# Patient Record
Sex: Female | Born: 1965 | Race: White | Hispanic: Yes | Smoking: Never smoker
Health system: Southern US, Community
[De-identification: ages and names within clinical notes are randomized; demographics above are authoritative.]

## PROBLEM LIST (undated history)

## (undated) DIAGNOSIS — N200 Calculus of kidney: Secondary | ICD-10-CM

## (undated) DIAGNOSIS — I10 Essential (primary) hypertension: Secondary | ICD-10-CM

---

## 2008-09-09 ENCOUNTER — Emergency Department (HOSPITAL_COMMUNITY): Admission: EM | Admit: 2008-09-09 | Discharge: 2008-09-09 | Payer: Self-pay | Admitting: Emergency Medicine

## 2010-05-19 LAB — URINE CULTURE
Colony Count: NO GROWTH
Culture: NO GROWTH

## 2010-05-19 LAB — URINE MICROSCOPIC-ADD ON

## 2010-05-19 LAB — URINALYSIS, ROUTINE W REFLEX MICROSCOPIC
Glucose, UA: NEGATIVE mg/dL
Ketones, ur: NEGATIVE mg/dL
Nitrite: NEGATIVE
Protein, ur: NEGATIVE mg/dL

## 2010-07-28 IMAGING — CR DG LUMBAR SPINE COMPLETE 4+V
5 series · 5 of 5 positions shown · non-contrast
Comparison: None

CLINICAL DATA: Back pain.  No injury.

LUMBAR SPINE - COMPLETE 4+ VIEW

[t l-spine a.p.]
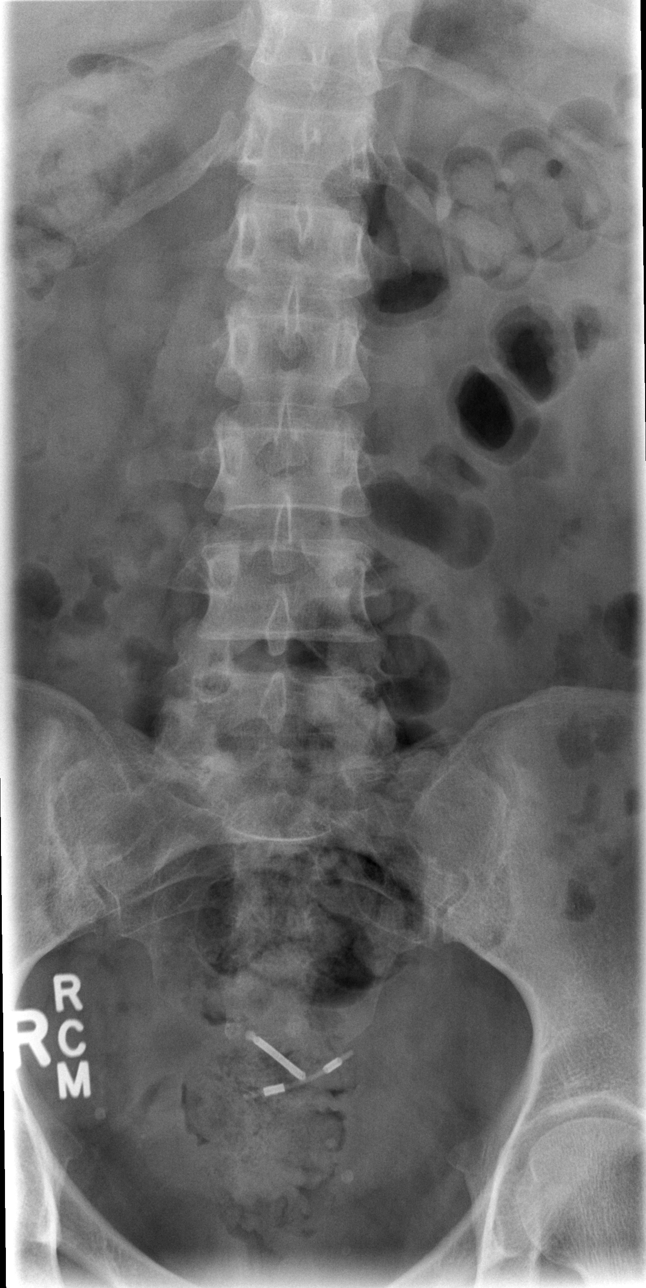

[t l-spine oblique exposure (1 of 2)]
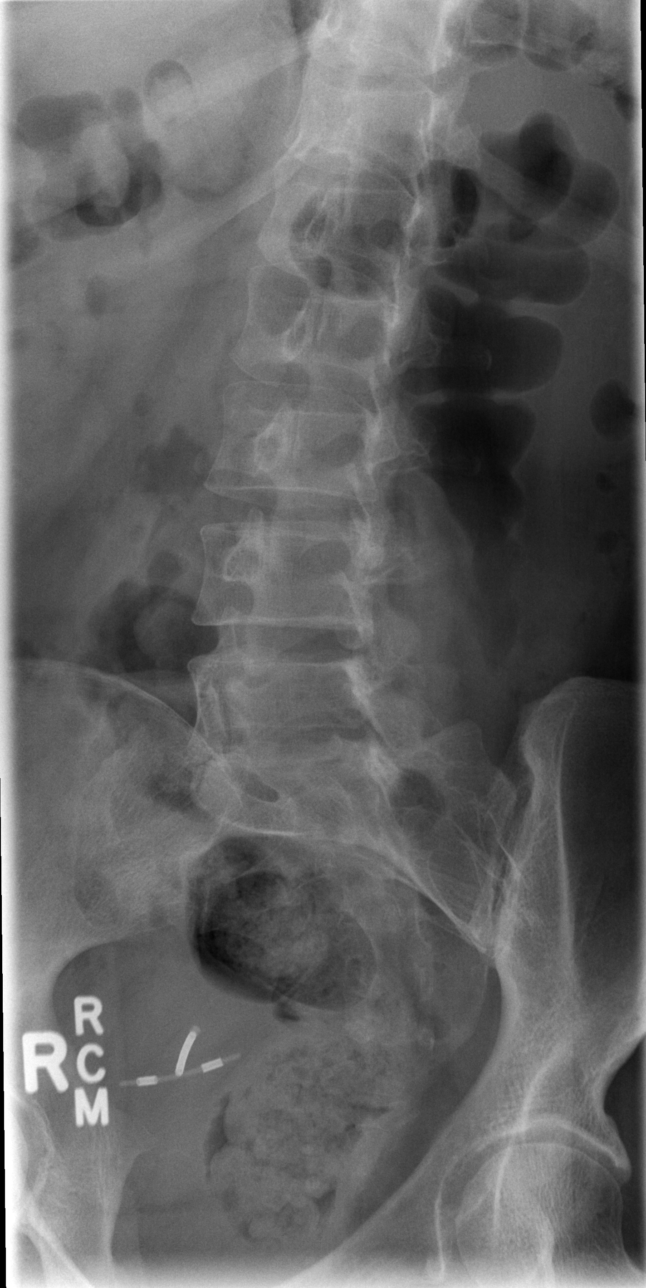

[t l-spine oblique exposure (2 of 2)]
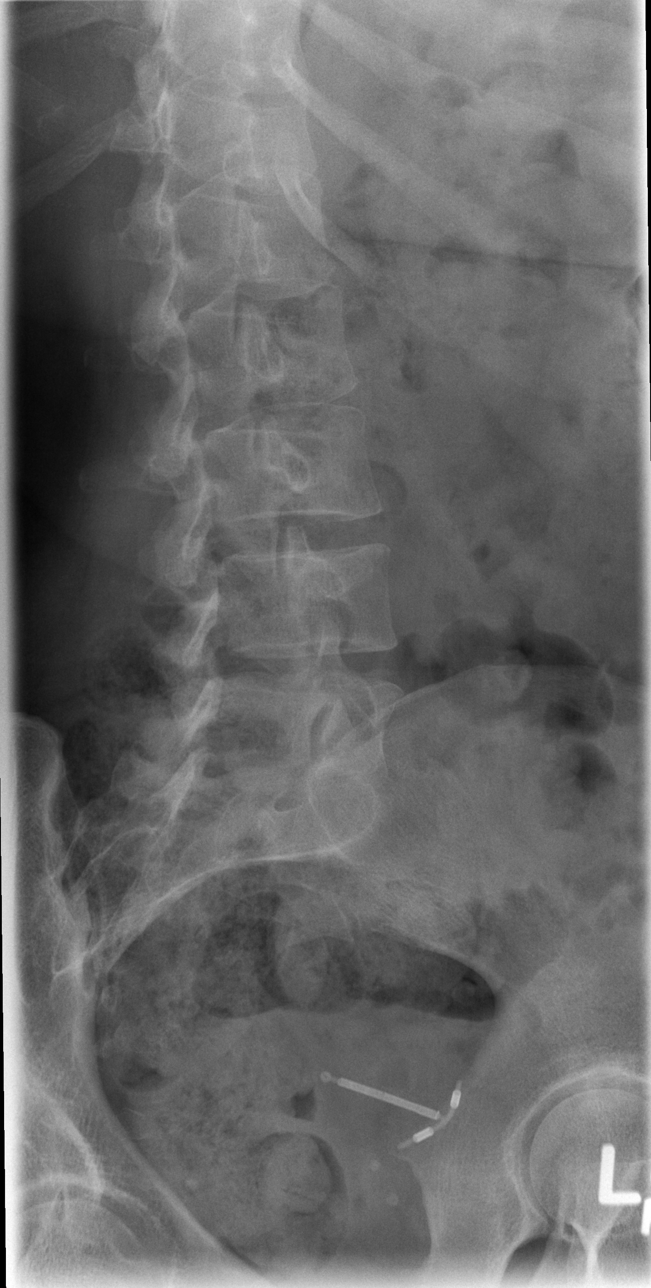

[t l-spine lat]
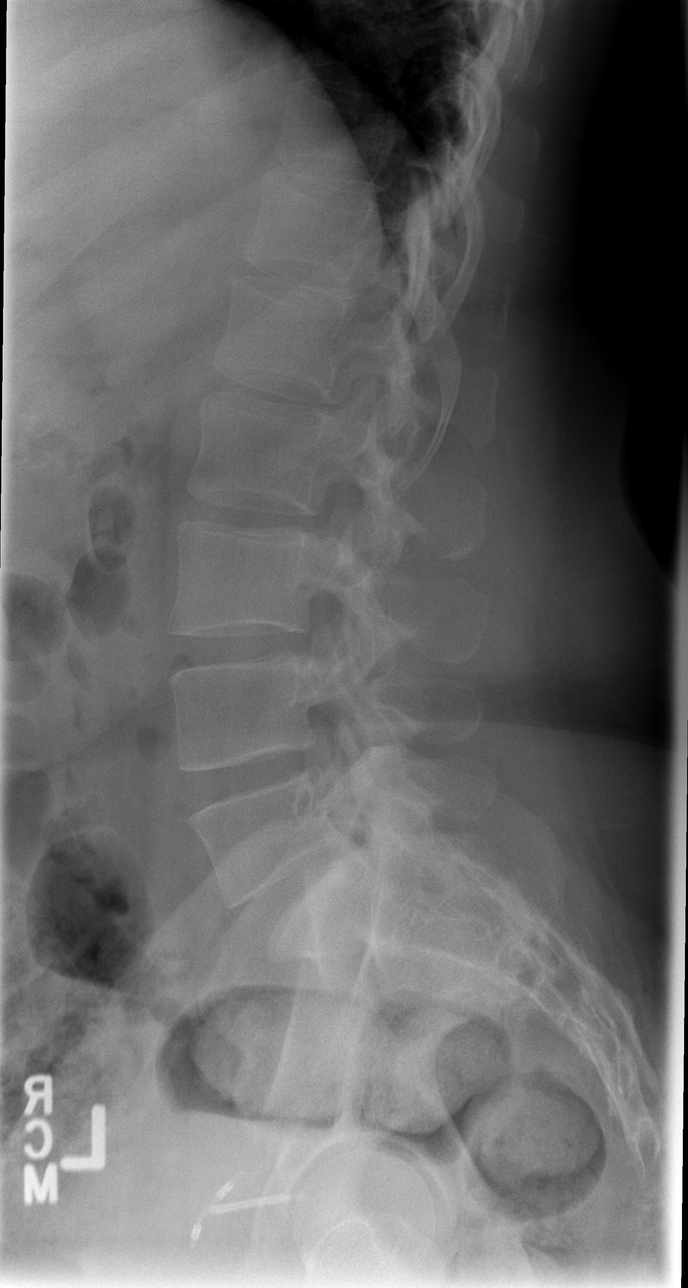

[t l-spine l5-s1 spot]
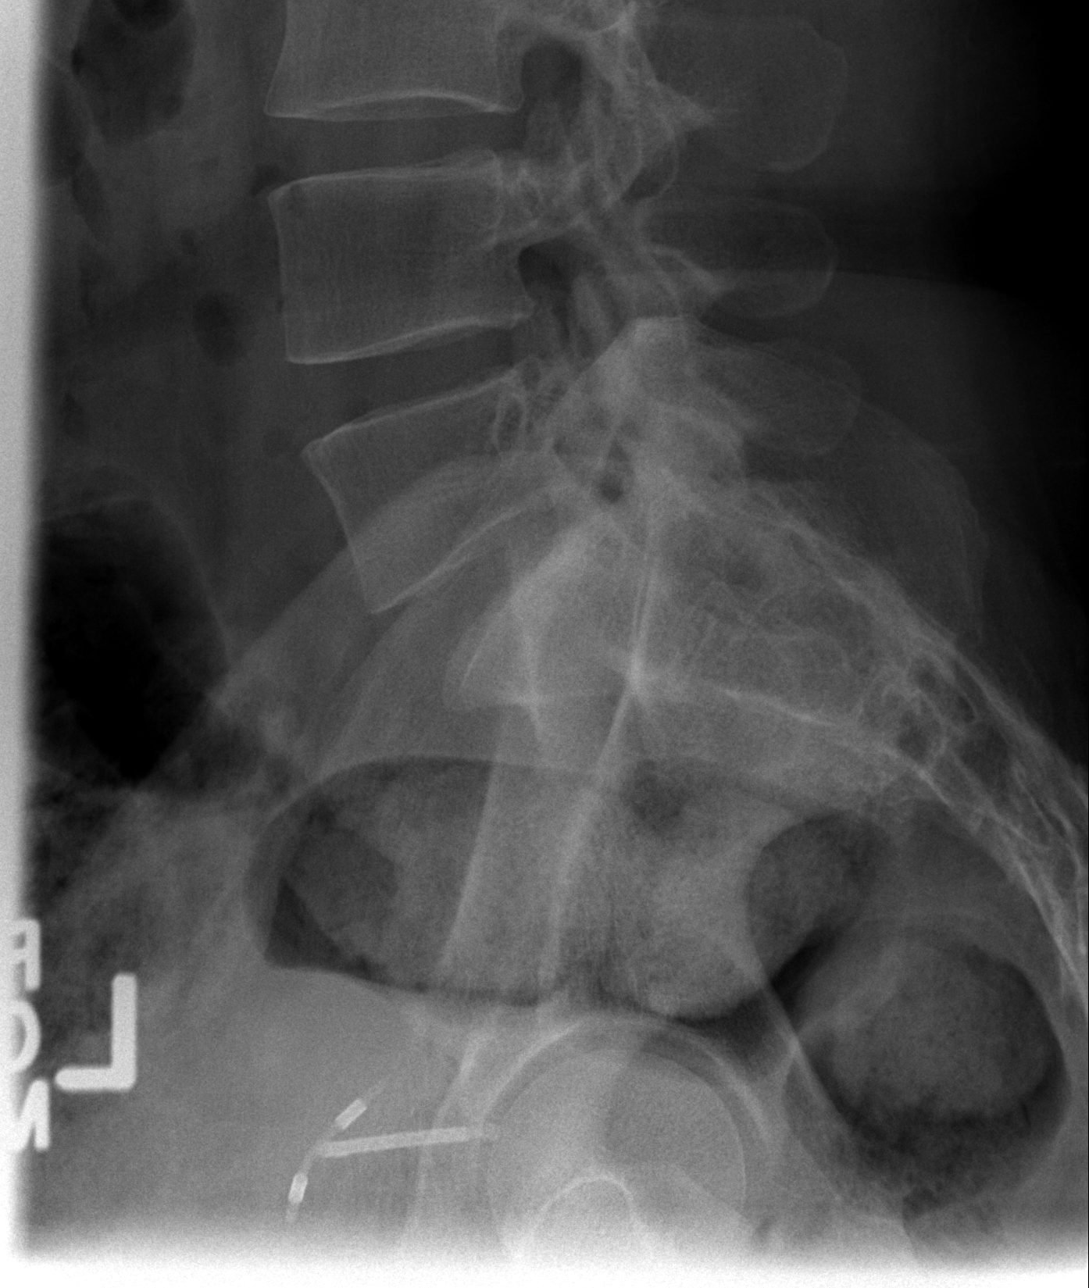

[5 of 5 positions shown; findings below may reference images not displayed]

FINDINGS: There are five lumbar-type vertebral bodies.  No fracture
or malalignment.  Disc spaces well maintained.  SI joints are
symmetric. IUD noted in place.
IMPRESSION: No acute findings.

## 2019-02-06 ENCOUNTER — Emergency Department (HOSPITAL_COMMUNITY)
Admission: EM | Admit: 2019-02-06 | Discharge: 2019-02-06 | Disposition: A | Discharge status: Home / Self Care | Payer: Self-pay | Attending: Emergency Medicine | Admitting: Emergency Medicine

## 2019-02-06 ENCOUNTER — Encounter (HOSPITAL_COMMUNITY): Payer: Self-pay | Admitting: Emergency Medicine

## 2019-02-06 ENCOUNTER — Other Ambulatory Visit: Payer: Self-pay

## 2019-02-06 ENCOUNTER — Emergency Department (HOSPITAL_COMMUNITY): Payer: Self-pay

## 2019-02-06 DIAGNOSIS — I10 Essential (primary) hypertension: Secondary | ICD-10-CM | POA: Insufficient documentation

## 2019-02-06 DIAGNOSIS — U071 COVID-19: Secondary | ICD-10-CM | POA: Insufficient documentation

## 2019-02-06 DIAGNOSIS — R05 Cough: Secondary | ICD-10-CM | POA: Diagnosis present

## 2019-02-06 HISTORY — DX: Essential (primary) hypertension: I10

## 2019-02-06 HISTORY — DX: Calculus of kidney: N20.0

## 2019-02-06 LAB — CBC
HCT: 44.3 % (ref 36.0–46.0)
Hemoglobin: 14.4 g/dL (ref 12.0–15.0)
MCH: 26.1 pg (ref 26.0–34.0)
MCHC: 32.5 g/dL (ref 30.0–36.0)
MCV: 80.4 fL (ref 80.0–100.0)
Platelets: 205 10*3/uL (ref 150–400)
RBC: 5.51 MIL/uL — ABNORMAL HIGH (ref 3.87–5.11)
RDW: 13.2 % (ref 11.5–15.5)
WBC: 5.6 10*3/uL (ref 4.0–10.5)
nRBC: 0 % (ref 0.0–0.2)

## 2019-02-06 LAB — RESPIRATORY PANEL BY RT PCR (FLU A&B, COVID)
Influenza A by PCR: NEGATIVE
Influenza B by PCR: NEGATIVE
SARS Coronavirus 2 by RT PCR: POSITIVE — AB

## 2019-02-06 LAB — COMPREHENSIVE METABOLIC PANEL
ALT: 31 U/L (ref 0–44)
AST: 31 U/L (ref 15–41)
Albumin: 3.5 g/dL (ref 3.5–5.0)
Alkaline Phosphatase: 105 U/L (ref 38–126)
Anion gap: 10 (ref 5–15)
BUN: 8 mg/dL (ref 6–20)
CO2: 24 mmol/L (ref 22–32)
Calcium: 8.6 mg/dL — ABNORMAL LOW (ref 8.9–10.3)
Chloride: 102 mmol/L (ref 98–111)
Creatinine, Ser: 0.46 mg/dL (ref 0.44–1.00)
GFR calc Af Amer: >60 mL/min (ref >60)
GFR calc non Af Amer: >60 mL/min (ref >60)
Glucose, Bld: 162 mg/dL — ABNORMAL HIGH (ref 70–99)
Potassium: 3.8 mmol/L (ref 3.5–5.1)
Sodium: 136 mmol/L (ref 135–145)
Total Bilirubin: 0.2 mg/dL — ABNORMAL LOW (ref 0.3–1.2)
Total Protein: 6.9 g/dL (ref 6.5–8.1)

## 2019-02-06 LAB — LIPASE, BLOOD: Lipase: 31 U/L (ref 11–51)

## 2019-02-06 MED ORDER — IBUPROFEN 400 MG PO TABS
400.0000 mg | ORAL_TABLET | Freq: Once | ORAL | Status: AC
  Administered 2019-02-06: 16:00:00 400 mg via ORAL
  Filled 2019-02-06: qty 1

## 2019-02-06 NOTE — ED Notes (Signed)
Discharged instructions reviewed with patient using medical interpreter. States family will be picking patient up. Denies any further questions before discharge.

## 2019-02-06 NOTE — ED Triage Notes (Addendum)
Pt c/o fever, chills, cough, back pain, and generalized body aches x 8 days.  Tested for COVID on Tuesday and has not received results.  Sister is currently hospitalized for COVID.  Other family members tested on same day received negative results but she has not been called with results.  [employer] used for Spanish interpretor for triage.

## 2019-02-06 NOTE — Discharge Instructions (Addendum)
It was our pleasure to provide your ER care today - we hope that you feel better.  Your covid test is positive - see covid instructions/quarantine instructions. Rest, drink plenty of fluids, stay active and take full and deep breaths. Take acetaminophen or ibuprofen for fever/body aches.   Follow up with primary care doctor in the next couple weeks if symptoms fail to improve.  Return to ER if worse, new symptoms, persistent vomiting, increased trouble breathing, or other concern.

## 2019-02-06 NOTE — ED Provider Notes (Signed)
[facility] EMERGENCY DEPARTMENT Provider Note   CSN: [id number] Arrival date & time: 02/06/19  1036     History Chief Complaint  Patient presents with  . ? COVID  . Back Pain  . Fever  . Chills    Monica Ruiz is a 53 y.o. female.  Patient c/o fever, non productive cough, body aches, for the past week. Family member recently tested positive for covid - states went to somewhere in [place] for test but never got her results back. Symptoms acute onset, moderate, persistent, felt worse today. Chest soreness w cough, otherwise no exertional cp, no pleuritic chest pain. Denies leg pain or swelling. No sore throat or runny nose. No trouble breathing. Denies vomiting, did have mild diarrhea yesterday. No dysuria or gu c/o.   The history is provided by the patient. A language interpreter was used.  Back Pain Associated symptoms: fever   Associated symptoms: no abdominal pain, no chest pain, no dysuria and no headaches   Fever Associated symptoms: cough, diarrhea and myalgias   Associated symptoms: no chest pain, no chills, no confusion, no dysuria, no headaches, no rash and no sore throat        Past Medical History:  Diagnosis Date  . Hypertension   . Kidney stones     There are no problems to display for this patient.   History reviewed. No pertinent surgical history.   OB History   No obstetric history on file.     No family history on file.  Social History   Tobacco Use  . Smoking status: Never Smoker  . Smokeless tobacco: Never Used  Substance Use Topics  . Alcohol use: Never  . Drug use: Never    Home Medications Prior to Admission medications   Not on File    Allergies    Patient has no allergy information on record.  Review of Systems   Review of Systems  Constitutional: Positive for fever. Negative for chills.  HENT: Negative for sore throat.   Eyes: Negative for redness.  Respiratory: Positive for cough.  Negative for shortness of breath.   Cardiovascular: Negative for chest pain and leg swelling.  Gastrointestinal: Positive for diarrhea. Negative for abdominal pain.  Genitourinary: Negative for dysuria and flank pain.  Musculoskeletal: Positive for back pain and myalgias. Negative for neck pain and neck stiffness.  Skin: Negative for rash.  Neurological: Negative for headaches.  Hematological: Does not bruise/bleed easily.  Psychiatric/Behavioral: Negative for confusion.    Physical Exam Updated Vital Signs BP 114/78 (BP Location: Left Arm)   Pulse (!) 102   Temp 99.2 F (37.3 C) (Oral)   Resp 18   SpO2 98%   Physical Exam Vitals and nursing note reviewed.  Constitutional:      Appearance: Normal appearance. She is well-developed.  HENT:     Head: Atraumatic.     Nose: Nose normal.     Mouth/Throat:     Mouth: Mucous membranes are moist.  Eyes:     General: No scleral icterus.    Conjunctiva/sclera: Conjunctivae normal.  Neck:     Trachea: No tracheal deviation.  Cardiovascular:     Rate and Rhythm: Normal rate and regular rhythm.     Pulses: Normal pulses.     Heart sounds: Normal heart sounds. No murmur. No friction rub. No gallop.   Pulmonary:     Effort: Pulmonary effort is normal. No respiratory distress.     Breath sounds: Normal breath sounds.  Abdominal:     General: Bowel sounds are normal. There is no distension.     Palpations: Abdomen is soft.     Tenderness: There is no abdominal tenderness. There is no guarding.  Genitourinary:    Comments: No cva tenderness.  Musculoskeletal:        General: No swelling or tenderness.     Cervical back: Normal range of motion and neck supple. No rigidity. No muscular tenderness.     Right lower leg: No edema.     Left lower leg: No edema.  Skin:    General: Skin is warm and dry.     Findings: No rash.  Neurological:     Mental Status: She is alert.     Comments: Alert, speech normal.   Psychiatric:        Mood  and Affect: Mood normal.     ED Results / Procedures / Treatments   Labs (all labs ordered are listed, but only abnormal results are displayed) Results for orders placed or performed during the hospital encounter of 02/06/19  Respiratory Panel by RT PCR (Flu A&B, Covid) - Nasopharyngeal Swab   Specimen: Nasopharyngeal Swab  Result Value Ref Range   SARS Coronavirus 2 by RT PCR POSITIVE (A) NEGATIVE   Influenza A by PCR NEGATIVE NEGATIVE   Influenza B by PCR NEGATIVE NEGATIVE  CBC  Result Value Ref Range   WBC 5.6 4.0 - 10.5 K/uL   RBC 5.51 (H) 3.87 - 5.11 MIL/uL   Hemoglobin 14.4 12.0 - 15.0 g/dL   HCT 25.344.3 66.436.0 - 40.346.0 %   MCV 80.4 80.0 - 100.0 fL   MCH 26.1 26.0 - 34.0 pg   MCHC 32.5 30.0 - 36.0 g/dL   RDW 47.413.2 25.911.5 - 56.315.5 %   Platelets 205 150 - 400 K/uL   nRBC 0.0 0.0 - 0.2 %  CMET  Result Value Ref Range   Sodium 136 135 - 145 mmol/L   Potassium 3.8 3.5 - 5.1 mmol/L   Chloride 102 98 - 111 mmol/L   CO2 24 22 - 32 mmol/L   Glucose, Bld 162 (H) 70 - 99 mg/dL   BUN 8 6 - 20 mg/dL   Creatinine, Ser 8.750.46 0.44 - 1.00 mg/dL   Calcium 8.6 (L) 8.9 - 10.3 mg/dL   Total Protein 6.9 6.5 - 8.1 g/dL   Albumin 3.5 3.5 - 5.0 g/dL   AST 31 15 - 41 U/L   ALT 31 0 - 44 U/L   Alkaline Phosphatase 105 38 - 126 U/L   Total Bilirubin 0.2 (L) 0.3 - 1.2 mg/dL   GFR calc non Af Amer >60 >60 mL/min   GFR calc Af Amer >60 >60 mL/min   Anion gap 10 5 - 15  Lipase  Result Value Ref Range   Lipase 31 11 - 51 U/L   EKG EKG Interpretation  Date/Time:  Sunday February 06 2019 12:32:31 EST Ventricular Rate:  97 PR Interval:    QRS Duration: 89 QT Interval:  337 QTC Calculation: 428 R Axis:   -84 Text Interpretation: Sinus rhythm Left axis deviation No previous tracing Confirmed by Cathren LaineSteinl, Assunta Pupo ([id number]) on 02/06/2019 12:42:40 PM   Radiology XR Chest Portable  Result Date: 02/06/2019 CLINICAL DATA:  53 year old with an 8 day history of fever, chills, cough, body aches and back pain.  Patient has a sister who is COVID-19 positive, but is awaiting her results. EXAM: PORTABLE CHEST 1 VIEW COMPARISON:  None. FINDINGS: Suboptimal inspiration  accounts for crowded bronchovascular markings, especially in the bases, and accentuates the cardiac silhouette. Taking this into account, cardiomediastinal silhouette unremarkable for AP portable technique. Lungs clear. Bronchovascular markings normal. Pulmonary vascularity normal. No visible pleural effusions. No pneumothorax. IMPRESSION: Suboptimal inspiration. No acute cardiopulmonary disease. Electronically Signed   By: Hulan Saas M.D.   On: 02/06/2019 13:08    Procedures Procedures (including critical care time)  Medications Ordered in ED Medications - No data to display  ED Course  I have reviewed the triage vital signs and the nursing notes.  Pertinent labs & imaging results that were available during my care of the patient were reviewed by me and considered in my medical decision making (see chart for details).    MDM Rules/Calculators/A&P                     Labs sent. CXR.  Used interpreter video line for communication.  Reviewed nursing notes and prior charts for additional history.   Po fluids. Acetaminophen po.  Labs reviewed/interpreted by me - covid is positive.   Monica Ruiz was evaluated in Emergency Department on 02/06/2019 for the symptoms described in the history of present illness. She was evaluated in the context of the global COVID-19 pandemic, which necessitated consideration that the patient might be at risk for infection with the SARS-CoV-2 virus that causes COVID-19. Institutional protocols and algorithms that pertain to the evaluation of patients at risk for COVID-19 are in a state of rapid change based on information released by regulatory bodies including the CDC and federal and state organizations. These policies and algorithms were followed during the patient's care in the ED.  CXR  reviewed/interpreted by me - no pna.  Po fluids. Ibuprofen po.  Recheck, breathing comfortably, room air pulse ox is 96-98%.   Patient currently appears stable for d/c.  Return precautions provided.      Final Clinical Impression(s) / ED Diagnoses Final diagnoses:  None    Rx / DC Orders ED Discharge Orders    None       Cathren Laine, MD 02/06/19 [phone]

## 2020-12-24 IMAGING — DX DG CHEST 1V PORT
1 series · 1 of 1 positions shown · non-contrast
Comparison: None.

CLINICAL DATA: 53-year-old with an 8 day history of fever, chills,
cough, body aches and back pain. Patient has a sister who is
[id number] positive, but is awaiting her results.

EXAM:
PORTABLE CHEST 1 VIEW

[chest]
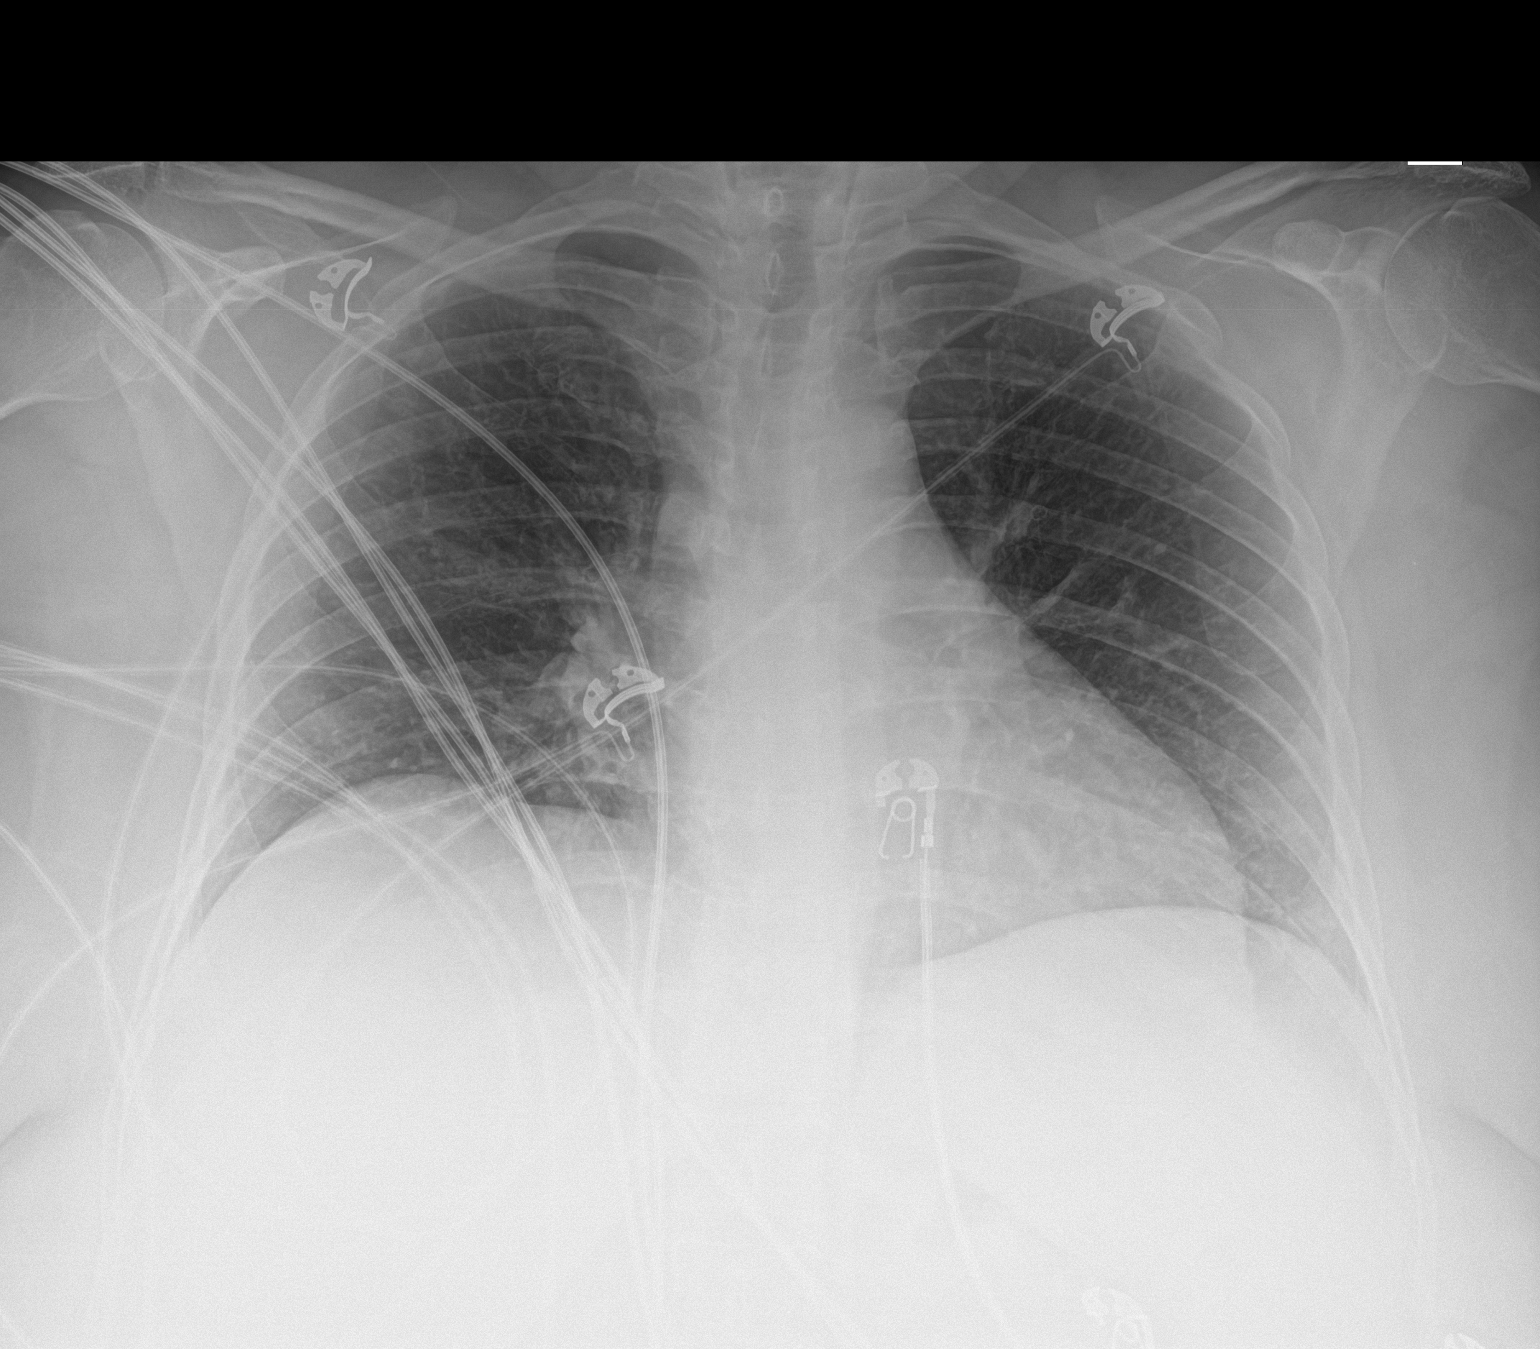

[1 of 1 positions shown; findings below may reference images not displayed]

FINDINGS: Suboptimal inspiration accounts for crowded bronchovascular
markings, especially in the bases, and accentuates the cardiac
silhouette. Taking this into account, cardiomediastinal silhouette
unremarkable for AP portable technique. Lungs clear. Bronchovascular
markings normal. Pulmonary vascularity normal. No visible pleural
effusions. No pneumothorax.
IMPRESSION: Suboptimal inspiration. No acute cardiopulmonary disease.

## 2024-03-03 NOTE — Progress Notes (Signed)
 Emailed PPW for Dx Mammogram w/ US  breast  to [facility]. SUB 03/03/2024

## 2024-03-04 ENCOUNTER — Encounter: Payer: Self-pay | Admitting: Nurse Practitioner

## 2024-03-04 ENCOUNTER — Other Ambulatory Visit: Payer: Self-pay | Admitting: Nurse Practitioner

## 2024-03-04 DIAGNOSIS — N6489 Other specified disorders of breast: Secondary | ICD-10-CM
